# Patient Record
Sex: Female | Born: 1950 | Race: White | Hispanic: No | Marital: Married | Smoking: Former smoker
Health system: Southern US, Community
[De-identification: ages and names within clinical notes are randomized; demographics above are authoritative.]

## PROBLEM LIST (undated history)

## (undated) DIAGNOSIS — I739 Peripheral vascular disease, unspecified: Secondary | ICD-10-CM

## (undated) DIAGNOSIS — K219 Gastro-esophageal reflux disease without esophagitis: Secondary | ICD-10-CM

## (undated) DIAGNOSIS — N644 Mastodynia: Secondary | ICD-10-CM

## (undated) DIAGNOSIS — Z9889 Other specified postprocedural states: Secondary | ICD-10-CM

## (undated) DIAGNOSIS — F32A Depression, unspecified: Secondary | ICD-10-CM

## (undated) DIAGNOSIS — F419 Anxiety disorder, unspecified: Secondary | ICD-10-CM

## (undated) DIAGNOSIS — N63 Unspecified lump in unspecified breast: Secondary | ICD-10-CM

## (undated) HISTORY — DX: Depression, unspecified: F32.A

## (undated) HISTORY — DX: Peripheral vascular disease, unspecified: I73.9

## (undated) HISTORY — DX: Unspecified lump in unspecified breast: N63.0

## (undated) HISTORY — DX: Other specified postprocedural states: Z98.890

## (undated) HISTORY — DX: Mastodynia: N64.4

## (undated) HISTORY — PX: APPENDECTOMY: SHX54

## (undated) HISTORY — PX: COLONOSCOPY: SHX174

## (undated) HISTORY — DX: Anxiety disorder, unspecified: F41.9

## (undated) HISTORY — DX: Gastro-esophageal reflux disease without esophagitis: K21.9

## (undated) HISTORY — PX: TONSILLECTOMY: SUR1361

---

## 2009-07-22 DEATH — deceased

## 2017-09-21 HISTORY — PX: COLONOSCOPY: SHX174

## 2017-09-21 HISTORY — PX: POLYPECTOMY: SHX149

## 2018-07-01 ENCOUNTER — Encounter: Payer: Self-pay | Admitting: Gastroenterology

## 2018-07-04 ENCOUNTER — Encounter: Payer: Self-pay | Admitting: *Deleted

## 2018-07-06 ENCOUNTER — Encounter (INDEPENDENT_AMBULATORY_CARE_PROVIDER_SITE_OTHER): Payer: Self-pay

## 2018-07-06 ENCOUNTER — Ambulatory Visit (INDEPENDENT_AMBULATORY_CARE_PROVIDER_SITE_OTHER): Payer: Medicare Other | Admitting: Gastroenterology

## 2018-07-06 ENCOUNTER — Encounter: Payer: Self-pay | Admitting: Gastroenterology

## 2018-07-06 VITALS — BP 142/86 | HR 72 | Ht 66.14 in | Wt 187.4 lb

## 2018-07-06 DIAGNOSIS — K5909 Other constipation: Secondary | ICD-10-CM

## 2018-07-06 DIAGNOSIS — R194 Change in bowel habit: Secondary | ICD-10-CM | POA: Diagnosis not present

## 2018-07-06 DIAGNOSIS — Z1211 Encounter for screening for malignant neoplasm of colon: Secondary | ICD-10-CM

## 2018-07-06 MED ORDER — NA SULFATE-K SULFATE-MG SULF 17.5-3.13-1.6 GM/177ML PO SOLN: 1.0000 | Freq: Once | ORAL | 0 refills | Status: AC

## 2018-07-06 NOTE — Progress Notes (Signed)
Rebecca Sherman    [id number]    [birth year]  Primary Care Physician:Hungarland, Jenetta Downer, MD  Referring Physician: No referring provider defined for this encounter.  Chief complaint: Change in bowel habits HPI: 67 year old Caucasian female here for new patient visit with complaints of change in bowel habits in the past 6 months.  She is passing small stool, narrow caliber and also has fragmentation with having to go up multiple times in the morning before she feels she has evacuated completely.  Denies any recent dietary changes or medication changes.  She usually eats low fiber diet and does not drink much water throughout the day.  Denies any rectal bleeding or blood in stool.  No rectal pain or discomfort. No family history of colon cancer. She thinks her last colonoscopy was about 12 years ago in [place], report is not available during this visit.  She does not recall having any polyps removed.    Outpatient Encounter Medications as of 07/06/2018  Medication Sig  . aspirin EC 81 MG tablet Take 81 mg by mouth daily.   No facility-administered encounter medications on file as of 07/06/2018.     Allergies as of 07/06/2018  . (No Known Allergies)    Past Medical History:  Diagnosis Date  . H/O colonoscopy    2007 normal  . Lump or mass in breast    pt denies  . Mastodynia   . PAD (peripheral artery disease) ([place])     Past Surgical History:  Procedure Laterality Date  . APPENDECTOMY    . TONSILLECTOMY      Family History  Problem Relation Age of Onset  . Breast cancer Mother 67  . Lupus Mother   . Peripheral Artery Disease Mother   . Alcohol abuse Father   . Peripheral Artery Disease Maternal Grandmother     Social History   Socioeconomic History  . Marital status: Married    Spouse name: Not on file  . Number of children: 2  . Years of education: Not on file  . Highest education level: Not on file  Occupational History  . Occupation:  retired  Scientific laboratory technician  . Financial resource strain: Not on file  . Food insecurity:    Worry: Not on file    Inability: Not on file  . Transportation needs:    Medical: Not on file    Non-medical: Not on file  Tobacco Use  . Smoking status: Former Smoker    Last attempt to quit: 07/06/2017    Years since quitting: 1.0  . Smokeless tobacco: Never Used  Substance and Sexual Activity  . Alcohol use: Never    Frequency: Never  . Drug use: Never  . Sexual activity: Not on file  Lifestyle  . Physical activity:    Days per week: Not on file    Minutes per session: Not on file  . Stress: Not on file  Relationships  . Social connections:    Talks on phone: Not on file    Gets together: Not on file    Attends religious service: Not on file    Active member of club or organization: Not on file    Attends meetings of clubs or organizations: Not on file    Relationship status: Not on file  . Intimate partner violence:    Fear of current or ex partner: Not on file    Emotionally abused: Not on file    Physically  abused: Not on file    Forced sexual activity: Not on file  Other Topics Concern  . Not on file  Social History Narrative  . Not on file      Review of systems: Review of Systems  Constitutional: Negative for fever and chills.  Positive for fatigue HENT: Negative.   Eyes: Negative for blurred vision.  Respiratory: Negative for cough, shortness of breath and wheezing.   Cardiovascular: Negative for chest pain and palpitations.  Gastrointestinal: as per HPI Genitourinary: Negative for dysuria, urgency, frequency and hematuria.  Musculoskeletal: Negative for myalgias, back pain and joint pain.  Skin: Negative for itching and rash.  Neurological: Negative for dizziness, tremors, focal weakness, seizures and loss of consciousness.  Endo/Heme/Allergies: Negative for seasonal allergies.  Psychiatric/Behavioral: Negative for depression, suicidal ideas and hallucinations.    All other systems reviewed and are negative.   Physical Exam: Vitals:   07/06/18 0902  BP: (!) 142/86  Pulse: 72   Body mass index is 30.11 kg/m. Gen:      No acute distress HEENT:  EOMI, sclera anicteric Neck:     No masses; no thyromegaly Lungs:    Clear to auscultation bilaterally; normal respiratory effort CV:         Regular rate and rhythm; no murmurs Abd:      + bowel sounds; soft, non-tender; no palpable masses, no distension Ext:    No edema; adequate peripheral perfusion Skin:      Warm and dry; no rash Neuro: alert and oriented x 3 Psych: normal mood and affect Rectal exam: Normal anal sphincter tone, no anal fissure or external hemorrhoids.  No palpable nodule, stricture or lesion   Data Reviewed:  Reviewed labs, radiology imaging, old records and pertinent past GI work up   Assessment and Plan/Recommendations:  67 year old female here with complaints of recent change in bowel habits in stool caliber.  We will schedule for colonoscopy for colorectal cancer screening and to evaluate recent change in bowel habits Advised patient to increase dietary fiber and fluid intake Start Benefiber 1 teaspoon 3 times daily with meals Increase water intake to 60 to 80 ounces daily The risks and benefits as well as alternatives of endoscopic procedure(s) have been discussed and reviewed. All questions answered. The patient agrees to proceed.  Damaris Hippo , MD [phone]    CC: No ref. provider found

## 2018-07-06 NOTE — Patient Instructions (Signed)
You have been scheduled for a colonoscopy. Please follow written instructions given to you at your visit today.  Please pick up your prep supplies at the pharmacy within the next 1-3 days. If you use inhalers (even only as needed), please bring them with you on the day of your procedure.   Take Benefiber 1 teaspoon three times a day with meals   Increase fluid intake water 10 -12 cups per day   Follow up in 2 months   If you are age 66 or older, your body mass index should be between 23-30. Your Body mass index is 30.11 kg/m. If this is out of the aforementioned range listed, please consider follow up with your Primary Care Provider.  If you are age 73 or younger, your body mass index should be between 19-25. Your Body mass index is 30.11 kg/m. If this is out of the aformentioned range listed, please consider follow up with your Primary Care Provider.    Thank you for choosing [facility]  Karleen Hampshire Nandigam,MD

## 2018-07-28 ENCOUNTER — Ambulatory Visit (AMBULATORY_SURGERY_CENTER): Payer: Medicare Other | Admitting: Gastroenterology

## 2018-07-28 ENCOUNTER — Encounter: Payer: Self-pay | Admitting: Gastroenterology

## 2018-07-28 VITALS — BP 131/83 | HR 71 | Temp 98.0°F | Resp 16 | Ht 66.0 in | Wt 187.0 lb

## 2018-07-28 DIAGNOSIS — R194 Change in bowel habit: Secondary | ICD-10-CM

## 2018-07-28 DIAGNOSIS — D124 Benign neoplasm of descending colon: Secondary | ICD-10-CM

## 2018-07-28 DIAGNOSIS — Z1211 Encounter for screening for malignant neoplasm of colon: Secondary | ICD-10-CM | POA: Diagnosis not present

## 2018-07-28 DIAGNOSIS — D123 Benign neoplasm of transverse colon: Secondary | ICD-10-CM | POA: Diagnosis not present

## 2018-07-28 DIAGNOSIS — D122 Benign neoplasm of ascending colon: Secondary | ICD-10-CM

## 2018-07-28 DIAGNOSIS — D120 Benign neoplasm of cecum: Secondary | ICD-10-CM

## 2018-07-28 MED ORDER — SODIUM CHLORIDE 0.9 % IV SOLN: 500.0000 mL | Freq: Once | INTRAVENOUS | Status: DC

## 2018-07-28 NOTE — Progress Notes (Signed)
Called to room to assist during endoscopic procedure.  Patient ID and intended procedure confirmed with present staff. Received instructions for my participation in the procedure from the performing physician.  

## 2018-07-28 NOTE — Patient Instructions (Signed)
Continue present medications. Please read handouts provided. Return to GI clinic in 6 months.     YOU HAD AN ENDOSCOPIC PROCEDURE TODAY AT [facility]:   Refer to the procedure report that was given to you for any specific questions about what was found during the examination.  If the procedure report does not answer your questions, please call your gastroenterologist to clarify.  If you requested that your care partner not be given the details of your procedure findings, then the procedure report has been included in a sealed envelope for you to review at your convenience later.  YOU SHOULD EXPECT: Some feelings of bloating in the abdomen. Passage of more gas than usual.  Walking can help get rid of the air that was put into your GI tract during the procedure and reduce the bloating. If you had a lower endoscopy (such as a colonoscopy or flexible sigmoidoscopy) you may notice spotting of blood in your stool or on the toilet paper. If you underwent a bowel prep for your procedure, you may not have a normal bowel movement for a few days.  Please Note:  You might notice some irritation and congestion in your nose or some drainage.  This is from the oxygen used during your procedure.  There is no need for concern and it should clear up in a day or so.  SYMPTOMS TO REPORT IMMEDIATELY:   Following lower endoscopy (colonoscopy or flexible sigmoidoscopy):  Excessive amounts of blood in the stool  Significant tenderness or worsening of abdominal pains  Swelling of the abdomen that is new, acute  Fever of 100F or higher   For urgent or emergent issues, a gastroenterologist can be reached at any hour by calling [phone].   DIET:  We do recommend a small meal at first, but then you may proceed to your regular diet.  Drink plenty of fluids but you should avoid alcoholic beverages for 24 hours.  ACTIVITY:  You should plan to take it easy for the rest of today and you should NOT  DRIVE or use heavy machinery until tomorrow (because of the sedation medicines used during the test).    FOLLOW UP: Our staff will call the number listed on your records the next business day following your procedure to check on you and address any questions or concerns that you may have regarding the information given to you following your procedure. If we do not reach you, we will leave a message.  However, if you are feeling well and you are not experiencing any problems, there is no need to return our call.  We will assume that you have returned to your regular daily activities without incident.  If any biopsies were taken you will be contacted by phone or by letter within the next 1-3 weeks.  Please call us at [phone] if you have not heard about the biopsies in 3 weeks.    SIGNATURES/CONFIDENTIALITY: You and/or your care partner have signed paperwork which will be entered into your electronic medical record.  These signatures attest to the fact that that the information above on your After Visit Summary has been reviewed and is understood.  Full responsibility of the confidentiality of this discharge information lies with you and/or your care-partner.

## 2018-07-28 NOTE — Op Note (Signed)
[facility] Patient Name: Rebecca Sherman Procedure Date: 07/28/2018 10:30 AM MRN: [id number] Endoscopist: Mauri Pole , MD Age: 67 Referring MD:  Date of Birth: 07/31/1951 Gender: Female Account #: [id number] Procedure:                Colonoscopy Indications:              Screening for colorectal malignant neoplasm Medicines:                Monitored Anesthesia Care Procedure:                Pre-Anesthesia Assessment:                           - Prior to the procedure, a History and Physical                            was performed, and patient medications and                            allergies were reviewed. The patient's tolerance of                            previous anesthesia was also reviewed. The risks                            and benefits of the procedure and the sedation                            options and risks were discussed with the patient.                            All questions were answered, and informed consent                            was obtained. Prior Anticoagulants: The patient has                            taken no previous anticoagulant or antiplatelet                            agents. ASA Grade Assessment: II - A patient with                            mild systemic disease. After reviewing the risks                            and benefits, the patient was deemed in                            satisfactory condition to undergo the procedure.                           After obtaining informed consent, the colonoscope  was passed under direct vision. Throughout the                            procedure, the patient's blood pressure, pulse, and                            oxygen saturations were monitored continuously. The                            Model PCF-[id number]) scope was introduced                            through the anus and advanced to the the cecum,                            identified  by appendiceal orifice and ileocecal                            valve. The colonoscopy was performed without                            difficulty. The patient tolerated the procedure                            well. The quality of the bowel preparation was                            excellent. The ileocecal valve, appendiceal                            orifice, and rectum were photographed. Scope In: 10:34:49 AM Scope Out: 10:53:12 AM Scope Withdrawal Time: 0 hours 13 minutes 15 seconds  Total Procedure Duration: 0 hours 18 minutes 23 seconds  Findings:                 The perianal and digital rectal examinations were                            normal.                           A 2 mm polyp was found in the cecum. The polyp was                            sessile. The polyp was removed with a cold biopsy                            forceps. Resection and retrieval were complete.                           Five sessile polyps were found in the descending                            colon, transverse colon, ascending colon and  ileocecal valve. The polyps were 5 to 8 mm in size.                            These polyps were removed with a cold snare.                            Resection and retrieval were complete.                           Scattered small and large-mouthed diverticula were                            found in the sigmoid colon and descending colon.                           Non-bleeding internal hemorrhoids were found during                            retroflexion. The hemorrhoids were medium-sized. Complications:            No immediate complications. Estimated Blood Loss:     Estimated blood loss was minimal. Impression:               - One 2 mm polyp in the cecum, removed with a cold                            biopsy forceps. Resected and retrieved.                           - Five 5 to 8 mm polyps in the descending colon, in                             the transverse colon, in the ascending colon and at                            the ileocecal valve, removed with a cold snare.                            Resected and retrieved.                           - Mild diverticulosis in the sigmoid colon and in                            the descending colon.                           - Non-bleeding internal hemorrhoids. Recommendation:           - Patient has a contact number available for                            emergencies. The signs and symptoms of potential  delayed complications were discussed with the                            patient. Return to normal activities tomorrow.                            Written discharge instructions were provided to the                            patient.                           - Resume previous diet.                           - Continue present medications.                           - Await pathology results.                           - Repeat colonoscopy in 3 - 5 years for                            surveillance based on pathology results.                           - Return to GI clinic in 6 months. Mauri Pole, MD 07/28/2018 10:59:56 AM This report has been signed electronically.

## 2018-07-28 NOTE — Progress Notes (Signed)
PT taken to PACU. Monitors in place. VSS. Report given to RN. 

## 2018-07-29 ENCOUNTER — Telehealth: Payer: Self-pay

## 2018-07-29 ENCOUNTER — Telehealth: Payer: Self-pay | Admitting: *Deleted

## 2018-07-29 NOTE — Telephone Encounter (Signed)
  Follow up Call-  Call back number 07/28/2018  Post procedure Call Back phone  # [phone]  Permission to leave phone message Yes     Patient questions:  Do you have a fever, pain , or abdominal swelling? No. Pain Score  0 *  Have you tolerated food without any problems? Yes.    Have you been able to return to your normal activities? Yes.    Do you have any questions about your discharge instructions: Diet   No. Medications  No. Follow up visit  No.  Do you have questions or concerns about your Care? No.  Actions: * If pain score is 4 or above: No action needed, pain <4.

## 2018-07-29 NOTE — Telephone Encounter (Signed)
Attempted to reach pt. With follow-up call following endoscopic procedure 07/28/2018.  LM on pt.'s voice mail.  Will try to reach pt. Again later today.

## 2018-08-02 ENCOUNTER — Encounter: Payer: Self-pay | Admitting: Gastroenterology

## 2019-01-13 ENCOUNTER — Encounter: Payer: Self-pay | Admitting: Gastroenterology

## 2019-01-26 ENCOUNTER — Encounter: Payer: Self-pay | Admitting: Gastroenterology

## 2019-01-26 ENCOUNTER — Other Ambulatory Visit: Payer: Self-pay

## 2019-01-26 ENCOUNTER — Ambulatory Visit (INDEPENDENT_AMBULATORY_CARE_PROVIDER_SITE_OTHER): Payer: Medicare PPO | Admitting: Gastroenterology

## 2019-01-26 VITALS — Ht 66.0 in | Wt 187.0 lb

## 2019-01-26 DIAGNOSIS — K5902 Outlet dysfunction constipation: Secondary | ICD-10-CM | POA: Diagnosis not present

## 2019-01-26 NOTE — Patient Instructions (Addendum)
  Increase Benefiber to 2 teaspoon 3 times daily with meals  Increase water intake to 8 to 10 cups daily  Try using step stool 7 inches tall/squatty potty during defecation  Follow-up in 4 weeks tele visit, You will need to call back for this appointment  I appreciate the  opportunity to care for you  Thank You   Harl Bowie , MD

## 2019-01-26 NOTE — Progress Notes (Signed)
Akaisha Truman    [id number]    1951/01/09  Primary Care Physician:Hungarland, Jenetta Downer, MD  Referring Physician: Yvone Neu, MD [facility] [place] [address]  This service was provided via audio and video telemedicine (Doximity) due to [place] 19 pandemic.  Patient location: Home Provider location: Office Used 2 patient identifiers to confirm the correct person. Explained the limitations in evaluation and management via telemedicine. Patient is aware of potential medical charges for this visit.  Patient consented to this virtual visit.  The persons participating in this telemedicine service were myself and the patient   Chief complaint: Constipation  HPI: 68 year old female with history of peripheral arterial disease, chronic GERD complains of constipation.  Colonoscopy July 28, 2018 for colorectal cancer screening: Removal of 5 sessile tubular adenomas, one sessile serrated polyp, sigmoid diverticulosis and hemorrhoids  She is taking Benefiber twice daily  Bowel habits still irregular, passing small pellets alternating with diarrhea, has abdominal cramping and discomfort if she does not evacuate.  No vomiting, melena or blood per rectum.  No weight loss.   Outpatient Encounter Medications as of 01/26/2019  Medication Sig  . aspirin EC 81 MG tablet Take 81 mg by mouth daily.  . permethrin (ELIMITE) 5 % cream APPLY SUFFICIENT AMOUNT AND MASSAGE INTO THE SKIN OF AREA INVOLVED REMOVE AFTER 8 TO 14 HOURS  . triamcinolone cream (KENALOG) 0.5 % APPLY A THIN FILM OF CREAM TOPICALLY TO THE AFFECTED AREAS THREE TIMES DAILY.   No facility-administered encounter medications on file as of 01/26/2019.     Allergies as of 01/26/2019  . (No Known Allergies)    Past Medical History:  Diagnosis Date  . GERD (gastroesophageal reflux disease)   . H/O colonoscopy    2007 normal  . Lump or mass in breast    pt  denies  . Mastodynia   . PAD (peripheral artery disease) ([place])     Past Surgical History:  Procedure Laterality Date  . APPENDECTOMY    . COLONOSCOPY    . TONSILLECTOMY      Family History  Problem Relation Age of Onset  . Breast cancer Mother 61  . Lupus Mother   . Peripheral Artery Disease Mother   . Alcohol abuse Father   . Peripheral Artery Disease Maternal Grandmother   . Colon polyps Neg Hx   . Colon cancer Neg Hx   . Esophageal cancer Neg Hx   . Rectal cancer Neg Hx   . Stomach cancer Neg Hx     Social History   Socioeconomic History  . Marital status: Married    Spouse name: Not on file  . Number of children: 2  . Years of education: Not on file  . Highest education level: Not on file  Occupational History  . Occupation: retired  Scientific laboratory technician  . Financial resource strain: Not on file  . Food insecurity:    Worry: Not on file    Inability: Not on file  . Transportation needs:    Medical: Not on file    Non-medical: Not on file  Tobacco Use  . Smoking status: Former Smoker    Last attempt to quit: 07/06/2017    Years since quitting: 1.5  . Smokeless tobacco: Never Used  Substance and Sexual Activity  . Alcohol use: Never    Frequency: Never  . Drug use: Never  . Sexual activity: Not on file  Lifestyle  . Physical activity:    Days per week: Not on file    Minutes per session: Not on file  . Stress: Not on file  Relationships  . Social connections:    Talks on phone: Not on file    Gets together: Not on file    Attends religious service: Not on file    Active member of club or organization: Not on file    Attends meetings of clubs or organizations: Not on file    Relationship status: Not on file  . Intimate partner violence:    Fear of current or ex partner: Not on file    Emotionally abused: Not on file    Physically abused: Not on file    Forced sexual activity: Not on file  Other Topics Concern  . Not on file  Social History Narrative   . Not on file      Review of systems: Review of Systems as per HPI All other systems reviewed and are negative.   Physical Exam: Vitals were not taken and physical exam was not performed during this virtual visit.  Data Reviewed:  Reviewed labs, radiology imaging, old records and pertinent past GI work up   Assessment and Plan/Recommendations:  68 year old female with irregular bowel habits, chronic constipation alternating with diarrhea, irritable bowel syndrome  Increase Benefiber 2 teaspoons 3 times daily with meals Increase water intake to 8 to 10 cups daily Use squatty potty during defecation to help with complete evacuation If continues to have constipation, will consider anorectal manometry to exclude dyssynergic defecation and refer to pelvic floor PT Follow-up tele medicine visit in 4 weeks    K. Denzil Magnuson , MD   CC: Hungarland, Jenetta Downer,*

## 2019-01-27 ENCOUNTER — Ambulatory Visit: Payer: Medicare Other | Admitting: Gastroenterology

## 2019-09-05 ENCOUNTER — Encounter (HOSPITAL_COMMUNITY): Payer: Self-pay

## 2019-09-05 ENCOUNTER — Emergency Department (HOSPITAL_COMMUNITY)
Admission: EM | Admit: 2019-09-05 | Discharge: 2019-09-05 | Disposition: A | Discharge status: Home / Self Care | Payer: Medicare PPO | Attending: Emergency Medicine | Admitting: Emergency Medicine

## 2019-09-05 ENCOUNTER — Emergency Department (HOSPITAL_COMMUNITY): Payer: Medicare PPO

## 2019-09-05 ENCOUNTER — Other Ambulatory Visit: Payer: Self-pay

## 2019-09-05 DIAGNOSIS — R0789 Other chest pain: Secondary | ICD-10-CM | POA: Diagnosis present

## 2019-09-05 DIAGNOSIS — R079 Chest pain, unspecified: Secondary | ICD-10-CM

## 2019-09-05 DIAGNOSIS — K2900 Acute gastritis without bleeding: Secondary | ICD-10-CM | POA: Diagnosis not present

## 2019-09-05 DIAGNOSIS — Z87891 Personal history of nicotine dependence: Secondary | ICD-10-CM | POA: Insufficient documentation

## 2019-09-05 DIAGNOSIS — Z20828 Contact with and (suspected) exposure to other viral communicable diseases: Secondary | ICD-10-CM | POA: Insufficient documentation

## 2019-09-05 DIAGNOSIS — Z79899 Other long term (current) drug therapy: Secondary | ICD-10-CM | POA: Insufficient documentation

## 2019-09-05 DIAGNOSIS — Z7982 Long term (current) use of aspirin: Secondary | ICD-10-CM | POA: Insufficient documentation

## 2019-09-05 LAB — URINALYSIS, ROUTINE W REFLEX MICROSCOPIC
Bilirubin Urine: NEGATIVE
Glucose, UA: NEGATIVE mg/dL
Hgb urine dipstick: NEGATIVE
Ketones, ur: NEGATIVE mg/dL
Leukocytes,Ua: NEGATIVE
Nitrite: NEGATIVE
Protein, ur: NEGATIVE mg/dL
Specific Gravity, Urine: 1.005 (ref 1.005–1.030)
pH: 6 (ref 5.0–8.0)

## 2019-09-05 LAB — BASIC METABOLIC PANEL
Anion gap: 10 (ref 5–15)
BUN: 18 mg/dL (ref 8–23)
CO2: 27 mmol/L (ref 22–32)
Calcium: 9.2 mg/dL (ref 8.9–10.3)
Chloride: 103 mmol/L (ref 98–111)
Creatinine, Ser: 0.75 mg/dL (ref 0.44–1.00)
GFR calc Af Amer: >60 mL/min (ref >60)
GFR calc non Af Amer: >60 mL/min (ref >60)
Glucose, Bld: 102 mg/dL — ABNORMAL HIGH (ref 70–99)
Potassium: 4 mmol/L (ref 3.5–5.1)
Sodium: 140 mmol/L (ref 135–145)

## 2019-09-05 LAB — HEPATIC FUNCTION PANEL
ALT: 21 U/L (ref 0–44)
AST: 16 U/L (ref 15–41)
Albumin: 4.7 g/dL (ref 3.5–5.0)
Alkaline Phosphatase: 72 U/L (ref 38–126)
Bilirubin, Direct: 0.1 mg/dL (ref 0.0–0.2)
Indirect Bilirubin: 0.8 mg/dL (ref 0.3–0.9)
Total Bilirubin: 0.9 mg/dL (ref 0.3–1.2)
Total Protein: 7.8 g/dL (ref 6.5–8.1)

## 2019-09-05 LAB — DIFFERENTIAL
Abs Immature Granulocytes: 0.02 10*3/uL (ref 0.00–0.07)
Basophils Absolute: 0.1 10*3/uL (ref 0.0–0.1)
Basophils Relative: 1 %
Eosinophils Absolute: 0.2 10*3/uL (ref 0.0–0.5)
Eosinophils Relative: 3 %
Immature Granulocytes: 0 %
Lymphocytes Relative: 20 %
Lymphs Abs: 1.5 10*3/uL (ref 0.7–4.0)
Monocytes Absolute: 0.6 10*3/uL (ref 0.1–1.0)
Monocytes Relative: 8 %
Neutro Abs: 5 10*3/uL (ref 1.7–7.7)
Neutrophils Relative %: 68 %

## 2019-09-05 LAB — TROPONIN I (HIGH SENSITIVITY)
Troponin I (High Sensitivity): <2 ng/L (ref <18)
Troponin I (High Sensitivity): 3 ng/L (ref <18)

## 2019-09-05 LAB — LIPASE, BLOOD: Lipase: 27 U/L (ref 11–51)

## 2019-09-05 LAB — CBC
HCT: 48.2 % — ABNORMAL HIGH (ref 36.0–46.0)
Hemoglobin: 15.2 g/dL — ABNORMAL HIGH (ref 12.0–15.0)
MCH: 30.5 pg (ref 26.0–34.0)
MCHC: 31.5 g/dL (ref 30.0–36.0)
MCV: 96.6 fL (ref 80.0–100.0)
Platelets: 211 10*3/uL (ref 150–400)
RBC: 4.99 MIL/uL (ref 3.87–5.11)
RDW: 12.7 % (ref 11.5–15.5)
WBC: 7.1 10*3/uL (ref 4.0–10.5)
nRBC: 0 % (ref 0.0–0.2)

## 2019-09-05 LAB — D-DIMER, QUANTITATIVE: D-Dimer, Quant: <0.27 ug/mL-FEU (ref 0.00–0.50)

## 2019-09-05 MED ORDER — SODIUM CHLORIDE 0.9% FLUSH: 3.0000 mL | Freq: Once | INTRAVENOUS | Status: DC

## 2019-09-05 MED ORDER — PANTOPRAZOLE SODIUM 20 MG PO TBEC: 20.0000 mg | DELAYED_RELEASE_TABLET | Freq: Every day | ORAL | 0 refills | Status: DC

## 2019-09-05 NOTE — ED Triage Notes (Signed)
Pt reports has been belching a lot and just not feeling well since Saturday.  Reports a heaviness in her chest.  Reports got dressed to go out today and almost passed out. Pt alert and oriented.  Ambulated to room.

## 2019-09-05 NOTE — ED Notes (Addendum)
Pt also reports has woke up with a headache for the past 3 days.  Reports it gets better after she gets up and moves around.  Pt says still has a dull headache.  Pt also reports a heavy piece of wood fell on pt's left leg about 1 month ago and has since had pain and burning.  Pt also reports a slight cough.

## 2019-09-05 NOTE — ED Provider Notes (Signed)
[facility] EMERGENCY DEPARTMENT Provider Note   CSN: [id number] Arrival date & time: 09/05/19  1240     History Chief Complaint  Patient presents with  . Chest Pain    Rebecca Sherman is a 68 y.o. female.  Patient complains of epigastric discomfort along with some chest discomfort sometimes a cough.  The history is provided by the patient. No language interpreter was used.  Chest Pain Pain location:  Epigastric Pain quality: aching   Pain radiates to:  Does not radiate Pain severity:  Moderate Onset quality:  Sudden Timing:  Intermittent Progression:  Waxing and waning Chronicity:  New Associated symptoms: cough   Associated symptoms: no abdominal pain, no back pain, no fatigue and no headache        Past Medical History:  Diagnosis Date  . GERD (gastroesophageal reflux disease)   . H/O colonoscopy    2007 normal  . Lump or mass in breast    pt denies  . Mastodynia   . PAD (peripheral artery disease) (HCC)     There are no problems to display for this patient.   Past Surgical History:  Procedure Laterality Date  . APPENDECTOMY    . COLONOSCOPY    . TONSILLECTOMY       OB History   No obstetric history on file.     Family History  Problem Relation Age of Onset  . Breast cancer Mother 35  . Lupus Mother   . Peripheral Artery Disease Mother   . Alcohol abuse Father   . Peripheral Artery Disease Maternal Grandmother   . Colon polyps Neg Hx   . Colon cancer Neg Hx   . Esophageal cancer Neg Hx   . Rectal cancer Neg Hx   . Stomach cancer Neg Hx     Social History   Tobacco Use  . Smoking status: Former Smoker    Quit date: 07/06/2017    Years since quitting: 2.1  . Smokeless tobacco: Never Used  Substance Use Topics  . Alcohol use: Never  . Drug use: Never    Home Medications Prior to Admission medications   Medication Sig Start Date End Date Taking? Authorizing Provider  aspirin EC 81 MG tablet Take 81 mg by mouth daily.    [provider]  ketoconazole (NIZORAL) 2 % shampoo Apply 1 application topically 2 (two) times a week. 05/30/19   [provider]  pantoprazole (PROTONIX) 20 MG tablet Take 1 tablet (20 mg total) by mouth daily. 09/05/19   Milton Ferguson, MD  permethrin (ELIMITE) 5 % cream APPLY SUFFICIENT AMOUNT AND MASSAGE INTO THE SKIN OF AREA INVOLVED REMOVE AFTER 8 TO 14 HOURS 05/27/18   [provider]  triamcinolone cream (KENALOG) 0.5 % APPLY A THIN FILM OF CREAM TOPICALLY TO THE AFFECTED AREAS THREE TIMES DAILY. 05/27/18   [provider]    Allergies    Patient has no known allergies.  Review of Systems   Review of Systems  Constitutional: Negative for appetite change and fatigue.  HENT: Negative for congestion, ear discharge and sinus pressure.   Eyes: Negative for discharge.  Respiratory: Positive for cough.   Cardiovascular: Positive for chest pain.  Gastrointestinal: Negative for abdominal pain and diarrhea.  Genitourinary: Negative for frequency and hematuria.  Musculoskeletal: Negative for back pain.  Skin: Negative for rash.  Neurological: Negative for seizures and headaches.  Psychiatric/Behavioral: Negative for hallucinations.    Physical Exam Updated Vital Signs BP 136/74   Pulse 73  Temp 98.2 F (36.8 C) (Oral)   Resp 18   SpO2 98%   Physical Exam Vitals and nursing note reviewed.  Constitutional:      Appearance: She is well-developed.  HENT:     Head: Normocephalic.     Nose: Nose normal.  Eyes:     General: No scleral icterus.    Conjunctiva/sclera: Conjunctivae normal.  Neck:     Thyroid: No thyromegaly.  Cardiovascular:     Rate and Rhythm: Normal rate and regular rhythm.     Heart sounds: No murmur. No friction rub. No gallop.   Pulmonary:     Breath sounds: No stridor. No wheezing or rales.  Chest:     Chest wall: No tenderness.  Abdominal:     General: There is no distension.     Tenderness: There is abdominal tenderness. There  is no rebound.     Comments: There epigastric  Musculoskeletal:        General: Normal range of motion.     Cervical back: Neck supple.  Lymphadenopathy:     Cervical: No cervical adenopathy.  Skin:    Findings: No erythema or rash.  Neurological:     Mental Status: She is alert and oriented to person, place, and time.     Motor: No abnormal muscle tone.     Coordination: Coordination normal.  Psychiatric:        Behavior: Behavior normal.     ED Results / Procedures / Treatments   Labs (all labs ordered are listed, but only abnormal results are displayed) Labs Reviewed  BASIC METABOLIC PANEL - Abnormal; Notable for the following components:      Result Value   Glucose, Bld 102 (*)    All other components within normal limits  CBC - Abnormal; Notable for the following components:   Hemoglobin 15.2 (*)    HCT 48.2 (*)    All other components within normal limits  URINALYSIS, ROUTINE W REFLEX MICROSCOPIC - Abnormal; Notable for the following components:   Color, Urine STRAW (*)    All other components within normal limits  HEPATIC FUNCTION PANEL  LIPASE, BLOOD  DIFFERENTIAL  D-DIMER, QUANTITATIVE (NOT AT [facility])  TROPONIN I (HIGH SENSITIVITY)  TROPONIN I (HIGH SENSITIVITY)    EKG EKG Interpretation  Date/Time:  Tuesday September 05 2019 12:57:39 EST Ventricular Rate:  80 PR Interval:    QRS Duration: 87 QT Interval:  414 QTC Calculation: 478 R Axis:   49 Text Interpretation: Sinus rhythm Anterior infarct, old Confirmed by Milton Ferguson [phone]) on 09/05/2019 1:32:19 PM   Radiology DG Chest Port 1 View  Result Date: 09/05/2019 CLINICAL DATA:  Pt reports has been belching a lot and just not feeling well since Saturday. Reports a heaviness in her chest. Reports got dressed to go out today and almost passed out. Pt alert and oriented. Hx GERD, former smoker EXAM: PORTABLE CHEST 1 VIEW COMPARISON:  None. FINDINGS: Cardiac silhouette is normal in size. No mediastinal or  hilar masses. No evidence of adenopathy. Lungs are clear.  No pleural effusion or pneumothorax. Skeletal structures are grossly intact. IMPRESSION: No active disease. Electronically Signed   By: Lajean Manes M.D.   On: 09/05/2019 13:21    Procedures Procedures (including critical care time)  Medications Ordered in ED Medications  sodium chloride flush (NS) 0.9 % injection 3 mL (3 mLs Intravenous Not Given 09/05/19 1336)    ED Course  I have reviewed the triage vital signs and the  nursing notes.  Pertinent labs & imaging results that were available during my care of the patient were reviewed by me and considered in my medical decision making (see chart for details).    MDM Rules/Calculators/A&P                     Labs are unremarkable including troponin.  X-ray unremarkable.  Patient will be placed on protonic and will follow up with her PCP.  She will get a Covid test that is now pending Final Clinical Impression(s) / ED Diagnoses Final diagnoses:  Acute superficial gastritis without hemorrhage    Rx / DC Orders ED Discharge Orders         Ordered    pantoprazole (PROTONIX) 20 MG tablet  Daily     09/05/19 1528           Milton Ferguson, MD 09/05/19 [phone]

## 2019-09-05 NOTE — Discharge Instructions (Addendum)
Follow-up with your family doctor next week for recheck. 

## 2019-09-06 LAB — NOVEL CORONAVIRUS, NAA (HOSP ORDER, SEND-OUT TO REF LAB; TAT 18-24 HRS): SARS-CoV-2, NAA: NOT DETECTED

## 2019-09-07 ENCOUNTER — Telehealth: Payer: Self-pay | Admitting: General Practice

## 2019-09-07 NOTE — Telephone Encounter (Signed)
Negative COVID results given. Patient results "NOT Detected." Caller expressed understanding. ° °

## 2020-10-14 IMAGING — DX DG CHEST 1V PORT
1 series · 1 of 1 positions shown · non-contrast
Comparison: None.

CLINICAL DATA: Pt reports has been belching a lot and just not
feeling well since [REDACTED]. Reports a heaviness in her chest.
Reports got dressed to go out today and almost passed out. Pt alert
and oriented. Hx GERD, former smoker

EXAM:
PORTABLE CHEST 1 VIEW

[chest ap]
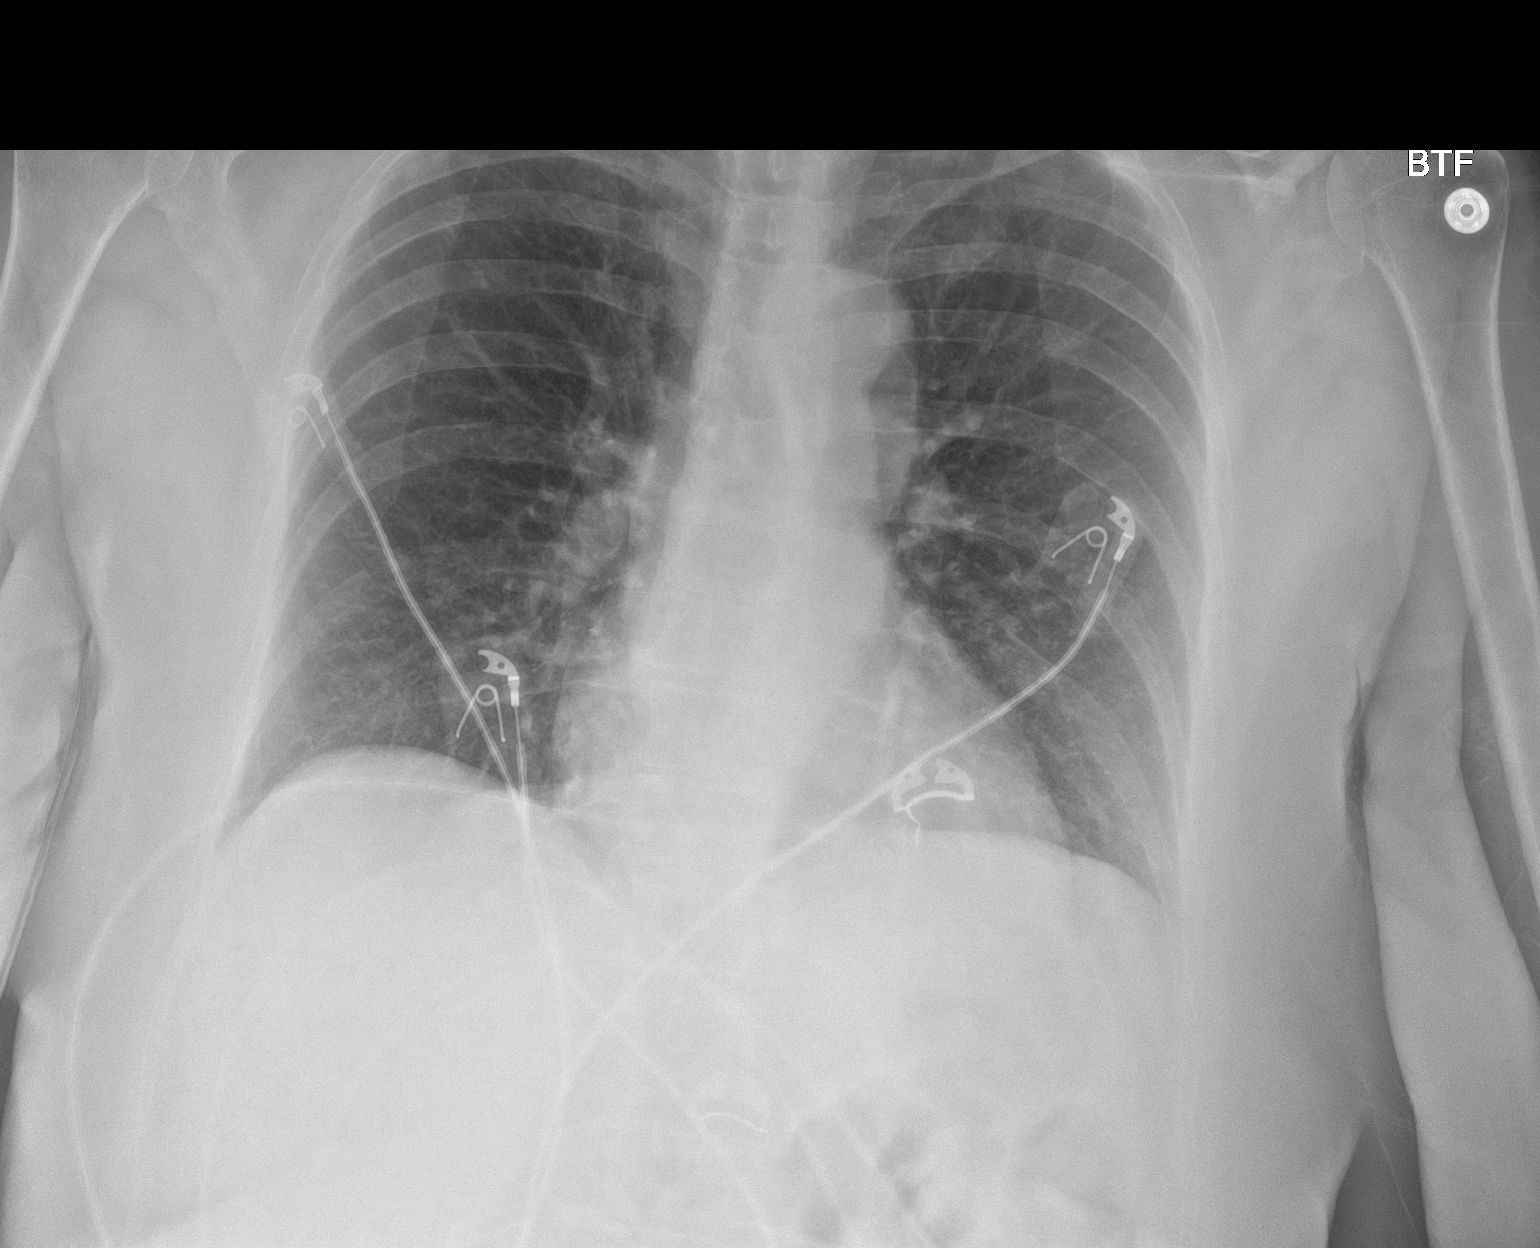

[1 of 1 positions shown; findings below may reference images not displayed]

FINDINGS: Cardiac silhouette is normal in size. No mediastinal or hilar
masses. No evidence of adenopathy.

Lungs are clear.  No pleural effusion or pneumothorax.

Skeletal structures are grossly intact.
IMPRESSION: No active disease.

## 2021-08-20 ENCOUNTER — Encounter: Payer: Self-pay | Admitting: Gastroenterology

## 2021-08-21 ENCOUNTER — Ambulatory Visit: Payer: Medicare PPO | Admitting: Gastroenterology

## 2021-10-09 ENCOUNTER — Ambulatory Visit (AMBULATORY_SURGERY_CENTER): Payer: Medicare PPO

## 2021-10-09 ENCOUNTER — Other Ambulatory Visit: Payer: Self-pay

## 2021-10-09 VITALS — Ht 66.0 in | Wt 189.0 lb

## 2021-10-09 DIAGNOSIS — Z86010 Personal history of colonic polyps: Secondary | ICD-10-CM

## 2021-10-09 MED ORDER — PEG 3350-KCL-NA BICARB-NACL 420 G PO SOLR: 4000.0000 mL | Freq: Once | ORAL | 0 refills | Status: AC

## 2021-10-09 NOTE — Progress Notes (Signed)
No egg or soy allergy known to patient  No issues known to pt with past sedation with any surgeries or procedures Patient denies ever being told they had issues or difficulty with intubation  No FH of Malignant Hyperthermia Pt is not on diet pills Pt is not on home 02  Pt is not on blood thinners  Pt denies issues with constipation  No A fib or A flutter NO PA's for preps discussed with pt in PV today  Discussed with pt there will be an out-of-pocket cost for prep and that varies from $0 to 70 + dollars - pt verbalized understanding  Due to the COVID-19 pandemic we are asking patients to follow certain guidelines in PV and the [place]   Pt aware of COVID protocols and [facility] guidelines  PV completed over the phone. Pt verified name, DOB, address and insurance during PV today.  Pt mailed instruction packet with copy of consent form to read and not return, and instructions.  Pt encouraged to call with questions or issues.  If pt has My chart, procedure instructions sent via My Chart

## 2021-10-20 ENCOUNTER — Telehealth: Payer: Self-pay | Admitting: Gastroenterology

## 2021-10-20 NOTE — Telephone Encounter (Signed)
Hey Dr. 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 We rescheduled for 3/22.  Thank you

## 2021-10-20 NOTE — Telephone Encounter (Signed)
I am sorry to hear that.

## 2021-10-24 ENCOUNTER — Encounter: Payer: Medicare PPO | Admitting: Gastroenterology

## 2021-12-02 ENCOUNTER — Encounter: Payer: Self-pay | Admitting: Certified Registered Nurse Anesthetist

## 2021-12-04 ENCOUNTER — Encounter: Payer: Self-pay | Admitting: Gastroenterology

## 2021-12-10 ENCOUNTER — Encounter: Payer: Medicare PPO | Admitting: Gastroenterology

## 2022-01-25 ENCOUNTER — Encounter: Payer: Self-pay | Admitting: Certified Registered Nurse Anesthetist

## 2022-02-02 ENCOUNTER — Ambulatory Visit (AMBULATORY_SURGERY_CENTER): Payer: Medicare PPO | Admitting: Gastroenterology

## 2022-02-02 ENCOUNTER — Encounter: Payer: Self-pay | Admitting: Gastroenterology

## 2022-02-02 VITALS — BP 125/68 | HR 60 | Temp 97.1°F | Resp 12 | Ht 67.0 in | Wt 189.0 lb

## 2022-02-02 DIAGNOSIS — Z86010 Personal history of colonic polyps: Secondary | ICD-10-CM

## 2022-02-02 DIAGNOSIS — D122 Benign neoplasm of ascending colon: Secondary | ICD-10-CM | POA: Diagnosis not present

## 2022-02-02 MED ORDER — SODIUM CHLORIDE 0.9 % IV SOLN: 500.0000 mL | Freq: Once | INTRAVENOUS | Status: DC

## 2022-02-02 NOTE — Progress Notes (Signed)
Called to room to assist during endoscopic procedure.  Patient ID and intended procedure confirmed with present staff. Received instructions for my participation in the procedure from the performing physician.  

## 2022-02-02 NOTE — Progress Notes (Signed)
Rebecca Sherman ? ? ?Primary Care Physician:  Yvone Neu, MD ? ? ?Reason for Procedure:  History of adenomatous colon polyps ? ?Plan:    Surveillance colonoscopy with possible interventions as needed ? ? ? ? ?HPI: Rebecca Sherman is a very pleasant 71 y.o. female here for surveillance colonoscopy. ?Denies any nausea, vomiting, abdominal pain, melena or bright red blood per rectum ? ?The risks and benefits as well as alternatives of endoscopic procedure(s) have been discussed and reviewed. All questions answered. The patient agrees to proceed. ? ? ? ?Past Medical History:  ?Diagnosis Date  ? Anxiety   ? on meds  ? Depression   ? on meds  ? GERD (gastroesophageal reflux disease)   ? hx of-not on meds at this time (10/09/2021)  ? H/O colonoscopy   ? 2007 normal  ? Lump or mass in breast   ? pt denies  ? Mastodynia   ? PAD (peripheral artery disease) ([place])   ? ? ?Past Surgical History:  ?Procedure Laterality Date  ? APPENDECTOMY    ? COLONOSCOPY  2019  ? SSP/ TA  ? POLYPECTOMY  2019  ? SSP/ TA  ? TONSILLECTOMY    ? ? ?Prior to Admission medications   ?Medication Sig Start Date End Date Taking? Authorizing Provider  ?escitalopram (LEXAPRO) 10 MG tablet Take 1 tablet by mouth daily at 6 (six) AM.   Yes [provider]  ? ? ?Current Outpatient Medications  ?Medication Sig Dispense Refill  ? escitalopram (LEXAPRO) 10 MG tablet Take 1 tablet by mouth daily at 6 (six) AM.    ? ?Current Facility-Administered Medications  ?Medication Dose Route Frequency Provider Last Rate Last Admin  ? 0.9 %  sodium chloride infusion  500 mL Intravenous Once Phoebie Shad, Venia Minks, MD      ? ? ?Allergies as of 02/02/2022  ? (No Known Allergies)  ? ? ?Family History  ?Problem Relation Age of Onset  ? Breast cancer Mother 14  ? Lupus Mother   ? Peripheral Artery Disease Mother   ? Alcohol abuse Father   ? Peripheral Artery Disease Maternal Grandmother   ? Colon polyps Neg Hx   ? Colon cancer Neg Hx    ? Esophageal cancer Neg Hx   ? Rectal cancer Neg Hx   ? Stomach cancer Neg Hx   ? ? ?Social History  ? ?Socioeconomic History  ? Marital status: Married  ?  Spouse name: Not on file  ? Number of children: 2  ? Years of education: Not on file  ? Highest education level: Not on file  ?Occupational History  ? Occupation: retired  ?Tobacco Use  ? Smoking status: Former  ?  Types: Cigarettes  ?  Quit date: 07/06/2017  ?  Years since quitting: 4.5  ? Smokeless tobacco: Never  ?Vaping Use  ? Vaping Use: Never used  ?Substance and Sexual Activity  ? Alcohol use: Never  ? Drug use: Never  ? Sexual activity: Not on file  ?Other Topics Concern  ? Not on file  ?Social History Narrative  ? Not on file  ? ?Social Determinants of Health  ? ?Financial Resource Strain: Not on file  ?Food Insecurity: Not on file  ?Transportation Needs: Not on file  ?Sherman Activity: Not on file  ?Stress: Not on file  ?Social Connections: Not on file  ?Intimate Partner Violence: Not on file  ? ? ?Review of Systems: ? ?All other review of systems negative except as  mentioned in the HPI. ? ?Sherman Exam: ?Vital signs in last 24 hours: ?BP (!) 156/77   Pulse 61   Temp (!) 97.1 ?F (36.2 ?C) (Skin)   Ht '5\' 7"'$  (1.702 m)   Wt 189 lb (85.7 kg)   SpO2 97%   BMI 29.60 kg/m?  ?General:   Alert, NAD ?Lungs:  Clear .   ?Heart:  Regular rate and rhythm ?Abdomen:  Soft, nontender and nondistended. ?Neuro/Psych:  Alert and cooperative. Normal mood and affect. A and O x 3 ? ?Reviewed labs, radiology imaging, old records and pertinent past GI work up ? ?Patient is appropriate for planned procedure(s) and anesthesia in an ambulatory setting ? ? ?K. Denzil Magnuson , MD ?[phone]  ? ? ?  ?

## 2022-02-02 NOTE — Patient Instructions (Signed)
Thank you for letting us take care of your healthcare needs today, ?Please see handouts given to you on Polyps, Diverticulosis and Hemorrhoids. ? ? ?YOU HAD AN ENDOSCOPIC PROCEDURE TODAY AT [facility]:   Refer to the procedure report that was given to you for any specific questions about what was found during the examination.  If the procedure report does not answer your questions, please call your gastroenterologist to clarify.  If you requested that your care partner not be given the details of your procedure findings, then the procedure report has been included in a sealed envelope for you to review at your convenience later. ? ?YOU SHOULD EXPECT: Some feelings of bloating in the abdomen. Passage of more gas than usual.  Walking can help get rid of the air that was put into your GI tract during the procedure and reduce the bloating. If you had a lower endoscopy (such as a colonoscopy or flexible sigmoidoscopy) you may notice spotting of blood in your stool or on the toilet paper. If you underwent a bowel prep for your procedure, you may not have a normal bowel movement for a few days. ? ?Please Note:  You might notice some irritation and congestion in your nose or some drainage.  This is from the oxygen used during your procedure.  There is no need for concern and it should clear up in a day or so. ? ?SYMPTOMS TO REPORT IMMEDIATELY: ? ?Following lower endoscopy (colonoscopy or flexible sigmoidoscopy): ? Excessive amounts of blood in the stool ? Significant tenderness or worsening of abdominal pains ? Swelling of the abdomen that is new, acute ? Fever of 100?F or higher ? ? ?For urgent or emergent issues, a gastroenterologist can be reached at any hour by calling [phone]. ?Do not use MyChart messaging for urgent concerns.  ? ? ?DIET:  We do recommend a small meal at first, but then you may proceed to your regular diet.  Drink plenty of fluids but you should avoid alcoholic beverages for 24  hours. ? ?ACTIVITY:  You should plan to take it easy for the rest of today and you should NOT DRIVE or use heavy machinery until tomorrow (because of the sedation medicines used during the test).   ? ?FOLLOW UP: ?Our staff will call the number listed on your records 48-72 hours following your procedure to check on you and address any questions or concerns that you may have regarding the information given to you following your procedure. If we do not reach you, we will leave a message.  We will attempt to reach you two times.  During this call, we will ask if you have developed any symptoms of COVID 19. If you develop any symptoms (ie: fever, flu-like symptoms, shortness of breath, cough etc.) before then, please call [phone].  If you test positive for Covid 19 in the 2 weeks post procedure, please call and report this information to [place].   ? ?If any biopsies were taken you will be contacted by phone or by letter within the next 1-3 weeks.  Please call us at [phone] if you have not heard about the biopsies in 3 weeks.  ? ? ?SIGNATURES/CONFIDENTIALITY: ?You and/or your care partner have signed paperwork which will be entered into your electronic medical record.  These signatures attest to the fact that that the information above on your After Visit Summary has been reviewed and is understood.  Full responsibility of the confidentiality of this discharge information  lies with you and/or your care-partner.  ?

## 2022-02-02 NOTE — Progress Notes (Signed)
Report given to PACU, vss 

## 2022-02-02 NOTE — Op Note (Signed)
[facility] ?Patient Name: Rebecca Sherman ?Procedure Date: 02/02/2022 11:22 AM ?MRN: [id number] ?Endoscopist: Mauri Pole , MD ?Age: 71 ?Referring MD:  ?Date of Birth: 1950-10-14 ?Gender: Female ?Account #: [id number] ?Procedure:                Colonoscopy ?Indications:              High risk colon cancer surveillance: Personal  ?                          history of colonic polyps, High risk colon cancer  ?                          surveillance: Personal history of adenoma (10 mm or  ?                          greater in size), High risk colon cancer  ?                          surveillance: Personal history of multiple (3 or  ?                          more) adenomas ?Medicines:                Monitored Anesthesia Care ?Procedure:                Pre-Anesthesia Assessment: ?                          - Prior to the procedure, a History and Physical  ?                          was performed, and patient medications and  ?                          allergies were reviewed. The patient's tolerance of  ?                          previous anesthesia was also reviewed. The risks  ?                          and benefits of the procedure and the sedation  ?                          options and risks were discussed with the patient.  ?                          All questions were answered, and informed consent  ?                          was obtained. Prior Anticoagulants: The patient has  ?                          taken no previous anticoagulant or antiplatelet  ?  agents. ASA Grade Assessment: II - A patient with  ?                          mild systemic disease. After reviewing the risks  ?                          and benefits, the patient was deemed in  ?                          satisfactory condition to undergo the procedure. ?                          After obtaining informed consent, the colonoscope  ?                          was passed under direct vision. Throughout the  ?                           procedure, the patient's blood pressure, pulse, and  ?                          oxygen saturations were monitored continuously. The  ?                          Olympus [id number]-[id number] (#[id number]) Colonoscope was  ?                          introduced through the anus and advanced to the the  ?                          cecum, identified by appendiceal orifice and  ?                          ileocecal valve. The colonoscopy was performed  ?                          without difficulty. The patient tolerated the  ?                          procedure well. The quality of the bowel  ?                          preparation was good. The ileocecal valve,  ?                          appendiceal orifice, and rectum were photographed. ?Scope In: 11:31:28 AM ?Scope Out: 11:44:48 AM ?Scope Withdrawal Time: 0 hours 9 minutes 24 seconds  ?Total Procedure Duration: 0 hours 13 minutes 20 seconds  ?Findings:                 The perianal and digital rectal examinations were  ?                          normal. ?  A 5 mm polyp was found in the ascending colon. The  ?                          polyp was sessile. The polyp was removed with a  ?                          cold snare. Resection and retrieval were complete. ?                          A few small-mouthed diverticula were found in the  ?                          sigmoid colon. ?                          Non-bleeding external and internal hemorrhoids were  ?                          found during retroflexion. The hemorrhoids were  ?                          medium-sized. ?                          The exam was otherwise without abnormality. ?Complications:            No immediate complications. ?Estimated Blood Loss:     Estimated blood loss was minimal. ?Impression:               - One 5 mm polyp in the ascending colon, removed  ?                          with a cold snare. Resected and retrieved. ?                          - Diverticulosis  in the sigmoid colon. ?                          - Non-bleeding external and internal hemorrhoids. ?                          - The examination was otherwise normal. ?Recommendation:           - Patient has a contact number available for  ?                          emergencies. The signs and symptoms of potential  ?                          delayed complications were discussed with the  ?                          patient. Return to normal activities tomorrow.  ?                          Written discharge  instructions were provided to the  ?                          patient. ?                          - Resume previous diet. ?                          - Continue present medications. ?                          - Await pathology results. ?                          - Repeat colonoscopy in 5 years for surveillance  ?                          based on pathology results. ?Mauri Pole, MD ?02/02/2022 11:49:32 AM ?This report has been signed electronically. ?

## 2022-02-02 NOTE — Progress Notes (Signed)
Pt's states no medical or surgical changes since previsit or office visit. VS assessed by D.T 

## 2022-02-04 ENCOUNTER — Telehealth: Payer: Self-pay | Admitting: *Deleted

## 2022-02-04 NOTE — Telephone Encounter (Signed)
?  Follow up Call- ? ? ?  02/02/2022  ? 10:46 AM  ?Call back number  ?Post procedure Call Back phone  # [phone]  ?Permission to leave phone message Yes  ?  ? ?Patient questions: ? ?Do you have a fever, pain , or abdominal swelling? No. ?Pain Score  0 * ? ?Have you tolerated food without any problems? Yes.   ? ?Have you been able to return to your normal activities? Yes.   ? ?Do you have any questions about your discharge instructions: ?Diet   No. ?Medications  No. ?Follow up visit  No. ? ?Do you have questions or concerns about your Care? No. ? ?Actions: ?* If pain score is 4 or above: ?No action needed, pain <4. ? ? ?

## 2022-02-11 ENCOUNTER — Encounter: Payer: Self-pay | Admitting: Gastroenterology
